# Patient Record
Sex: Male | Born: 2000 | Race: Black or African American | Hispanic: No | Marital: Single | Smoking: Never smoker
Health system: Southern US, Community
[De-identification: ages and names within clinical notes are randomized; demographics above are authoritative.]

## PROBLEM LIST (undated history)

## (undated) DIAGNOSIS — R5600 Simple febrile convulsions: Secondary | ICD-10-CM

## (undated) DIAGNOSIS — F840 Autistic disorder: Secondary | ICD-10-CM

---
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
## 2022-01-18 ENCOUNTER — Other Ambulatory Visit: Payer: Self-pay | Admitting: Internal Medicine

## 2022-01-19 LAB — COMPLETE METABOLIC PANEL WITH GFR
AG Ratio: 1.8 (calc) (ref 1.0–2.5)
ALT: 30 U/L (ref 8–46)
AST: 21 U/L (ref 12–32)
Albumin: 4.4 g/dL (ref 3.6–5.1)
Alkaline phosphatase (APISO): 69 U/L (ref 46–169)
BUN: 12 mg/dL (ref 7–20)
CO2: 24 mmol/L (ref 20–32)
Calcium: 9.3 mg/dL (ref 8.9–10.4)
Chloride: 107 mmol/L (ref 98–110)
Creat: 0.84 mg/dL (ref 0.60–1.24)
Globulin: 2.5 g/dL (calc) (ref 2.1–3.5)
Glucose, Bld: 92 mg/dL (ref 65–99)
Potassium: 4 mmol/L (ref 3.8–5.1)
Sodium: 141 mmol/L (ref 135–146)
Total Bilirubin: 0.8 mg/dL (ref 0.2–1.1)
Total Protein: 6.9 g/dL (ref 6.3–8.2)
eGFR: 129 mL/min/{1.73_m2} (ref >=60)

## 2022-01-19 LAB — LIPID PANEL
Cholesterol: 123 mg/dL (ref <170)
HDL: 34 mg/dL — ABNORMAL LOW (ref >45)
LDL Cholesterol (Calc): 74 mg/dL (calc) (ref <110)
Non-HDL Cholesterol (Calc): 89 mg/dL (calc) (ref <120)
Total CHOL/HDL Ratio: 3.6 (calc) (ref <5.0)
Triglycerides: 69 mg/dL (ref <90)

## 2022-01-19 LAB — CBC
HCT: 40.8 % (ref 38.5–50.0)
Hemoglobin: 13.5 g/dL (ref 13.2–17.1)
MCH: 28.8 pg (ref 27.0–33.0)
MCHC: 33.1 g/dL (ref 32.0–36.0)
MCV: 87 fL (ref 80.0–100.0)
MPV: 10.4 fL (ref 7.5–12.5)
Platelets: 244 10*3/uL (ref 140–400)
RBC: 4.69 10*6/uL (ref 4.20–5.80)
RDW: 11.5 % (ref 11.0–15.0)
WBC: 3 10*3/uL — ABNORMAL LOW (ref 3.8–10.8)

## 2022-01-19 LAB — VITAMIN D 25 HYDROXY (VIT D DEFICIENCY, FRACTURES): Vit D, 25-Hydroxy: 12 ng/mL — ABNORMAL LOW (ref 30–100)

## 2022-04-25 ENCOUNTER — Emergency Department (HOSPITAL_COMMUNITY): Payer: Medicaid Other

## 2022-04-25 ENCOUNTER — Encounter (HOSPITAL_COMMUNITY): Payer: Self-pay | Admitting: Emergency Medicine

## 2022-04-25 ENCOUNTER — Emergency Department (HOSPITAL_COMMUNITY)
Admission: EM | Admit: 2022-04-25 | Discharge: 2022-04-25 | Disposition: A | Discharge status: Home / Self Care | Payer: Medicaid Other | Attending: Emergency Medicine | Admitting: Emergency Medicine

## 2022-04-25 ENCOUNTER — Other Ambulatory Visit: Payer: Self-pay

## 2022-04-25 DIAGNOSIS — R519 Headache, unspecified: Secondary | ICD-10-CM

## 2022-04-25 DIAGNOSIS — M542 Cervicalgia: Secondary | ICD-10-CM | POA: Insufficient documentation

## 2022-04-25 DIAGNOSIS — F840 Autistic disorder: Secondary | ICD-10-CM | POA: Diagnosis not present

## 2022-04-25 DIAGNOSIS — R110 Nausea: Secondary | ICD-10-CM | POA: Diagnosis not present

## 2022-04-25 LAB — COMPREHENSIVE METABOLIC PANEL
ALT: 28 U/L (ref 0–44)
AST: 23 U/L (ref 15–41)
Albumin: 4.9 g/dL (ref 3.5–5.0)
Alkaline Phosphatase: 54 U/L (ref 38–126)
Anion gap: 10 (ref 5–15)
BUN: 13 mg/dL (ref 6–20)
CO2: 24 mmol/L (ref 22–32)
Calcium: 10 mg/dL (ref 8.9–10.3)
Chloride: 104 mmol/L (ref 98–111)
Creatinine, Ser: 1.04 mg/dL (ref 0.61–1.24)
GFR, Estimated: >60 mL/min (ref >60)
Glucose, Bld: 120 mg/dL — ABNORMAL HIGH (ref 70–99)
Potassium: 3.3 mmol/L — ABNORMAL LOW (ref 3.5–5.1)
Sodium: 138 mmol/L (ref 135–145)
Total Bilirubin: 0.9 mg/dL (ref 0.3–1.2)
Total Protein: 7.8 g/dL (ref 6.5–8.1)

## 2022-04-25 LAB — CBC WITH DIFFERENTIAL/PLATELET
Abs Immature Granulocytes: 0.01 10*3/uL (ref 0.00–0.07)
Basophils Absolute: 0 10*3/uL (ref 0.0–0.1)
Basophils Relative: 1 %
Eosinophils Absolute: 0.1 10*3/uL (ref 0.0–0.5)
Eosinophils Relative: 1 %
HCT: 43.7 % (ref 39.0–52.0)
Hemoglobin: 15.3 g/dL (ref 13.0–17.0)
Immature Granulocytes: 0 %
Lymphocytes Relative: 47 %
Lymphs Abs: 1.8 10*3/uL (ref 0.7–4.0)
MCH: 29.3 pg (ref 26.0–34.0)
MCHC: 35 g/dL (ref 30.0–36.0)
MCV: 83.6 fL (ref 80.0–100.0)
Monocytes Absolute: 0.3 10*3/uL (ref 0.1–1.0)
Monocytes Relative: 7 %
Neutro Abs: 1.7 10*3/uL (ref 1.7–7.7)
Neutrophils Relative %: 44 %
Platelets: 243 10*3/uL (ref 150–400)
RBC: 5.23 MIL/uL (ref 4.22–5.81)
RDW: 11.8 % (ref 11.5–15.5)
WBC: 3.7 10*3/uL — ABNORMAL LOW (ref 4.0–10.5)
nRBC: 0 % (ref 0.0–0.2)

## 2022-04-25 NOTE — ED Notes (Signed)
Patient verbalizes understanding of discharge instructions. Opportunity for questioning and answers were provided. Armband removed by staff, pt discharged from ED.  

## 2022-04-25 NOTE — ED Notes (Signed)
Reports headache is better.  When he arrived headache was similar to his previous headaches per patient

## 2022-04-25 NOTE — ED Triage Notes (Signed)
Patient reports chronic headache and posterior neck pain for several weeks , denies injury , no stiffness or fever .
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
## 2022-04-30 ENCOUNTER — Ambulatory Visit (HOSPITAL_COMMUNITY)
Admission: EM | Admit: 2022-04-30 | Discharge: 2022-04-30 | Disposition: A | Discharge status: Home / Self Care | Payer: Medicaid Other | Attending: Family Medicine | Admitting: Family Medicine

## 2022-04-30 ENCOUNTER — Encounter (HOSPITAL_COMMUNITY): Payer: Self-pay

## 2022-04-30 ENCOUNTER — Ambulatory Visit (HOSPITAL_COMMUNITY): Payer: Self-pay

## 2022-04-30 DIAGNOSIS — R519 Headache, unspecified: Secondary | ICD-10-CM

## 2022-04-30 DIAGNOSIS — S0990XA Unspecified injury of head, initial encounter: Secondary | ICD-10-CM

## 2022-04-30 MED ORDER — BUTALBITAL-APAP-CAFFEINE 50-325-40 MG PO TABS: 1.0000 | ORAL_TABLET | Freq: Two times a day (BID) | ORAL | 0 refills | Status: AC | PRN

## 2022-04-30 MED ORDER — BUTALBITAL-APAP-CAFFEINE 50-325-40 MG PO TABS: 1.0000 | ORAL_TABLET | Freq: Two times a day (BID) | ORAL | 0 refills | Status: DC | PRN
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
## 2022-05-05 ENCOUNTER — Ambulatory Visit (HOSPITAL_COMMUNITY): Payer: Self-pay

## 2022-05-06 ENCOUNTER — Ambulatory Visit (INDEPENDENT_AMBULATORY_CARE_PROVIDER_SITE_OTHER): Payer: Medicaid Other | Admitting: Psychiatry

## 2022-05-06 VITALS — BP 111/77 | HR 93 | Ht 68.0 in | Wt 132.5 lb

## 2022-05-06 DIAGNOSIS — G44209 Tension-type headache, unspecified, not intractable: Secondary | ICD-10-CM

## 2022-05-06 MED ORDER — DICLOFENAC POTASSIUM 50 MG PO TABS: ORAL_TABLET | ORAL | 6 refills | Status: DC

## 2022-05-06 MED ORDER — ONDANSETRON 8 MG PO TBDP: 8.0000 mg | ORAL_TABLET | Freq: Three times a day (TID) | ORAL | 6 refills | Status: AC | PRN

## 2022-05-06 NOTE — Progress Notes (Signed)
Referring:  Scot Jun, FNP [address] [place],  [place] [address]  PCP: Raechel Ache, MD (Inactive)  Neurology was asked to evaluate Jose Morrison, a 21 year old male for a chief complaint of headaches.  Our recommendations of care will be communicated by shared medical record.    CC:  headaches  History provided from self, mom  HPI:  Medical co-morbidities: febrile seizures, autism  The patient presents for evaluation of headaches which began several weeks ago. No clear triggers for onset of headaches. Headaches are currently daily. They generally begin in the evening and last a couple of hours at a time. They are described as pressure in the left face and temple. He does have some nausea with them. No associated photophobia or phonophobia.  Takes Tylenol as needed which does help temporarily, but headache always returns.  [facility] 04/25/22 was unremarkable.  Headache History: Onset: several weeks ago Triggers: pressing on his eyes Aura: none Location: left face and temples Quality/Description: pressure Associated Symptoms:  Photophobia: no  Phonophobia: no  Nausea: yes Vomiting: no Worse with activity?: no Duration of headaches: 1-2 hour  Headache days per month: 30 Headache free days per month: 0  Current Treatment: Abortive Tylenol  Preventative none  Prior Therapies                                 Fioricet - hallucinations  LABS: CBC    Component Value Date/Time   WBC 3.7 (L) 04/25/2022 0319   RBC 5.23 04/25/2022 0319   HGB 15.3 04/25/2022 0319   HCT 43.7 04/25/2022 0319   PLT 243 04/25/2022 0319   MCV 83.6 04/25/2022 0319   MCH 29.3 04/25/2022 0319   MCHC 35.0 04/25/2022 0319   RDW 11.8 04/25/2022 0319   LYMPHSABS 1.8 04/25/2022 0319   MONOABS 0.3 04/25/2022 0319   EOSABS 0.1 04/25/2022 0319   BASOSABS 0.0 04/25/2022 0319      Latest Ref Rng & Units 04/25/2022    3:19 AM 01/18/2022   12:00 AM  CMP  Glucose 70 - 99 mg/dL  120  92   BUN 6 - 20 mg/dL 13  12   Creatinine 0.61 - 1.24 mg/dL 1.04  0.84   Sodium 135 - 145 mmol/L 138  141   Potassium 3.5 - 5.1 mmol/L 3.3  4.0   Chloride 98 - 111 mmol/L 104  107   CO2 22 - 32 mmol/L 24  24   Calcium 8.9 - 10.3 mg/dL 10.0  9.3   Total Protein 6.5 - 8.1 g/dL 7.8  6.9   Total Bilirubin 0.3 - 1.2 mg/dL 0.9  0.8   Alkaline Phos 38 - 126 U/L 54    AST 15 - 41 U/L 23  21   ALT 0 - 44 U/L 28  30      IMAGING:  [facility] 04/25/22: unremarkable  Imaging independently reviewed on May 06, 2022   Current Outpatient Medications on File Prior to Visit  Medication Sig Dispense Refill   butalbital-acetaminophen-caffeine (FIORICET) 50-325-40 MG tablet Take 1 tablet by mouth every 12 (twelve) hours as needed for headache. (Patient not taking: Reported on 05/06/2022) 14 tablet 0   diazepam (DIASTAT ACUDIAL) 10 MG GEL Place 7.5 mg rectally once. (Patient not taking: Reported on 05/06/2022) 7.5 mg 2   simethicone (MYLICON) 40 XB/1.4NW drops Take 0.6 mLs (40 mg total) by mouth 4 (four) times daily  as needed for flatulence (gas). (Patient not taking: Reported on 05/06/2022) 30 mL 0   No current facility-administered medications on file prior to visit.     Allergies: No Known Allergies  Family History: Migraine or other headaches in the family:  mother has migraines Aneurysms in a first degree relative:  no Brain tumors in the family:  no Other neurological illness in the family:   no  Past Medical History: Past Medical History:  Diagnosis Date   Autism    Febrile seizures (HCC)     Past Surgical History No past surgical history on file.  Social History: Social History   Tobacco Use   Smoking status: Never   Smokeless tobacco: Never  Substance Use Topics   Alcohol use: Never     ROS: Negative for fevers, chills. Positive for headaches. All other systems reviewed and negative unless stated otherwise in HPI.   Physical Exam:   Vital Signs: BP 111/77    Pulse 93   Ht 5\' 8"  (1.727 m)   Wt 132 lb 8 oz (60.1 kg)   BMI 20.15 kg/m  GENERAL: well appearing,in no acute distress,alert SKIN:  Color, texture, turgor normal. No rashes or lesions HEAD:  Normocephalic/atraumatic. CV:  RRR RESP: Normal respiratory effort MSK: no tenderness to palpation over occiput, neck, or shoulders  NEUROLOGICAL: Mental Status: Alert, oriented to person, place and time,Follows commands Cranial Nerves: PERRL, visual fields intact to confrontation, extraocular movements intact, facial sensation intact, no facial droop or ptosis, hearing grossly intact, no dysarthria,  Motor: muscle strength 5/5 both upper and lower extremities Reflexes: 2+ throughout Sensation: intact to light touch all 4 extremities Coordination: Finger-to- nose-finger intact bilaterally Gait: normal-based   IMPRESSION: 21 year old male with a history of febrile seizures, autism who presents for evaluation of headaches. CTH earlier this month was unremarkable. Neurological exam today is normal. His current headaches are most consistent with tension-type headache. Will start diclofenac for rescue. Discussed preventive options, and mother would like to research medications before starting one. Supplement information for headache prevention provided.  PLAN: -Rescue: Start diclofenac 50-100 mg PRN -Supplement information for headache prevention provided (Mg, B2, CoQ10) -Mother will research preventive options and let me know if they choose to pursue this   I spent a total of 24 minutes chart reviewing and counseling the patient. Headache education was done. Discussed treatment options including preventive and acute medications, and natural supplements. Discussed medication overuse headache and to limit use of acute treatments to no more than 2 days/week or 10 days/month. Discussed medication side effects, adverse reactions and drug interactions. Written educational materials and patient instructions  outlining all of the above were given.  Follow-up: 3 months   12, MD 05/06/2022   1:40 PM

## 2022-05-06 NOTE — Patient Instructions (Addendum)
Common preventive headache medications:  Antidepressants: amitriptyline, Cymbalta Anti convulsants: Topamax Blood pressure medication: propranolol   Start diclofenac as needed for migraines Take ondansetron as needed for nausea    GENERAL HEADACHE INSTRUCTIONS Headache Preventive Treatment: Please keep in mind that it takes 4-6 weeks for the medication to start working well and 2-3 months at the appropriate dose before deciding if it will be useful or not. If it is not helping at all by this time, then we will discuss other medications to try. Supplements may take 3-6 months until you see full effect.   Natural supplements: Magnesium Oxide or Magnesium Glycinate 500 mg at bed (up to 800 mg daily) Coenzyme Q10 300 mg in AM Vitamin B2- 200 mg twice a day  Add 1 supplement at a time since even natural supplements can have undesirable side effects. You can sometimes buy supplements cheaper (especially Coenzyme Q10) at www.WebmailGuide.co.za or at [employer].  Vitamins and herbs that show potential:  Magnesium: Magnesium (250 mg twice a day or 500 mg at bed) has a relaxant effect on smooth muscles such as blood vessels. Individuals suffering from frequent or daily headache usually have low magnesium levels which can be increase with daily supplementation of 400-750 mg. Three trials found 40-90% average headache reduction  when used as a preventative. Magnesium also demonstrated the benefit in menstrually related migraine.  Magnesium is part of the messenger system in the serotonin cascade and it is a good muscle relaxant.  It is also useful for constipation which can be a side effect of other medications used to treat migraine. Good sources include nuts, whole grains, and tomatoes. Side Effects: loose stool/diarrhea Riboflavin (vitamin B 2) 200 mg twice a day. This vitamin assists nerve cells in the production of ATP a principal energy storing molecule.  It is necessary for many chemical reactions in the  body.  There have been at least 3 clinical trials of riboflavin using 400 mg per day all of which suggested that migraine frequency can be decreased.  All 3 trials showed significant improvement in over half of migraine sufferers.  The supplement is found in bread, cereal, milk, meat, and poultry.  Most Americans get more riboflavin than the recommended daily allowance, however riboflavin deficiency is not necessary for the supplements to help prevent headache. Side effects: energizing, green urine  Coenzyme Q10: This is present in almost all cells in the body and is critical component for the conversion of energy.  Recent studies have shown that a nutritional supplement of CoQ10 can reduce the frequency of migraine attacks by improving the energy production of cells as with riboflavin.  Doses of 150 mg twice a day have been shown to be effective.  Melatonin: Increasing evidence shows correlation between melatonin secretion and headache conditions.  Melatonin supplementation has decreased headache intensity and duration.  It is widely used as a sleep aid.  Sleep is natures way of dealing with migraine.  A dose of 3 mg is recommended to start for headaches including cluster headache. Higher doses up to 15 mg has been reviewed for use in Cluster headache and have been used. The rationale behind using melatonin for cluster is that many theories regarding the cause of Cluster headache center around the disruption of the normal circadian rhythm in the brain.  This helps restore the normal circadian rhythm.  HEADACHE DIET: Foods and beverages which may trigger migraine Note that only 20% of headache patients are food sensitive. You will know if you are  food sensitive if you get a headache consistently 20 minutes to 2 hours after eating a certain food. Only cut out a food if it causes headaches, otherwise you might remove foods you enjoy! What matters most for diet is to eat a well balanced healthy diet full of  vegetables and low fat protein, and to not miss meals.  Chocolate, other sweets ALL cheeses except cottage and cream cheese Dairy products, yogurt, sour cream, ice cream Liver Meat extracts (Bovril, Marmite, meat tenderizers) Meats or fish which have undergone aging, fermenting, pickling or smoking. These include: Hotdogs,salami,Lox,sausage, mortadellas,smoked salmon, pepperoni, Pickled herring Pods of broad bean (English beans, Chinese pea pods, [place] (fava) beans, lima and navy beans Ripe avocado, ripe banana Yeast extracts or active yeast preparations such as Brewer's or Fleishman's (commercial bakes goods are permitted) Tomato based foods, pizza (lasagna, etc.)  MSG (monosodium glutamate) is disguised as many things; look for these common aliases: Monopotassium glutamate Autolysed yeast Hydrolysed protein Sodium caseinate "flavorings" "all natural preservatives" Nutrasweet  Avoid all other foods that convincingly provoke headaches.  Resources: The Dizzy Lu Duffel Your Headache Diet, migrainestrong.com  https://www.aguirre.org/  Caffeine and Migraine For patients that have migraine, caffeine intake more than 3 days per week can lead to dependency and increased migraine frequency. I would recommend cutting back on your caffeine intake as best you can. The recommended amount of caffeine is 200-300 mg daily, although migraine patients may experience dependency at even lower doses. While you may notice an increase in headache temporarily, cutting back will be helpful for headaches in the long run. For more information on caffeine and migraine, visit: https://americanmigrainefoundation.org/resource-library/caffeine-and-migraine/  Headache Prevention Strategies:  1. Maintain a headache diary; learn to identify and avoid triggers.  - This can be a simple note where you log when you had a headache, associated symptoms, and medications  used - There are several smartphone apps developed to help track migraines: Migraine Buddy, Migraine Monitor, Curelator N1-Headache App  Common triggers include: Emotional triggers: Emotional/Upset family or friends Emotional/Upset occupation Business reversal/success Anticipation anxiety Crisis-serious Post-crisis periodNew job/position   Physical triggers: Vacation Day Weekend Strenuous Exercise High Altitude Location New Move Menstrual Day Physical Illness Oversleep/Not enough sleep Weather changes Light: Photophobia or light sesnitivity treatment involves a balance between desensitization and reduction in overly strong input. Use dark polarized glasses outside, but not inside. Avoid bright or fluorescent light, but do not dim environment to the point that going into a normally lit room hurts. Consider FL-41 tint lenses, which reduce the most irritating wavelengths without blocking too much light.  These can be obtained at axonoptics.com or theraspecs.com Foods: see list above.  2. Limit use of acute treatments (over-the-counter medications, triptans, etc.) to no more than 2 days per week or 10 days per month to prevent medication overuse headache (rebound headache).    3. Follow a regular schedule (including weekends and holidays): Don't skip meals. Eat a balanced diet. 8 hours of sleep nightly. Minimize stress. Exercise 30 minutes per day. Being overweight is associated with a 5 times increased risk of chronic migraine. Keep well hydrated and drink 6-8 glasses of water per day.  4. Initiate non-pharmacologic measures at the earliest onset of your headache. Rest and quiet environment. Relax and reduce stress. Breathe2Relax is a free app that can instruct you on    some simple relaxtion and breathing techniques. Http://Dawnbuse.com is a    free website that provides teaching videos on relaxation.  Also, there are  many apps that  can be downloaded for "mindful" relaxation.  An  app called YOGA NIDRA will help walk you through mindfulness. Another app called Calm can be downloaded to give you a structured mindfulness guide with daily reminders and skill development. Headspace for guided meditation Mindfulness Based Stress Reduction Online Course: www.palousemindfulness.com Cold compresses.  5. Don't wait!! Take the maximum allowable dosage of prescribed medication at the first sign of migraine.  6. Compliance:  Take prescribed medication regularly as directed and at the first sign of a migraine.  7. Communicate:  Call your physician when problems arise, especially if your headaches change, increase in frequency/severity, or become associated with neurological symptoms (weakness, numbness, slurred speech, etc.).  8. Headache/pain management therapies: Consider various complementary methods, including medication, behavioral therapy, psychological counselling, biofeedback, massage therapy, acupuncture, dry needling, and other modalities.  Such measures may reduce the need for medications. Counseling for pain management, where patients learn to function and ignore/minimize their pain, seems to work very well.  9. Recommend changing family's attention and focus away from patient's headaches. Instead, emphasize daily activities. If first question of day is 'How are your headaches/Do you have a headache today?', then patient will constantly think about headaches, thus making them worse. Goal is to re-direct attention away from headaches, toward daily activities and other distractions.  10. Helpful Websites: www.AmericanHeadacheSociety.org PatentHood.ch www.headaches.org TightMarket.nl www.achenet.org

## 2022-05-10 ENCOUNTER — Other Ambulatory Visit: Payer: Self-pay | Admitting: Psychiatry

## 2022-05-10 MED ORDER — MELOXICAM 10 MG PO CAPS: 10.0000 mg | ORAL_CAPSULE | ORAL | 6 refills | Status: AC | PRN

## 2022-05-28 ENCOUNTER — Ambulatory Visit: Payer: Medicaid Other | Admitting: Psychiatry

## 2022-08-23 ENCOUNTER — Encounter: Payer: Self-pay | Admitting: Family Medicine

## 2022-08-23 ENCOUNTER — Ambulatory Visit: Payer: Medicaid Other | Admitting: Family Medicine

## 2022-08-23 NOTE — Patient Instructions (Incomplete)

## 2022-08-23 NOTE — Progress Notes (Deleted)
No chief complaint on file.   HISTORY OF PRESENT ILLNESS:  08/23/22 ALL:  Jose Morrison is a 22 y.o. male here today for follow up for  headaches. He was seen in consult with Dr Jose Morrison 04/2022 and started on diclofenac as needed for abortive therapy. Preventative meds discussed but declined. Since,    HISTORY (copied from Dr Jose Morrison previous note)  HPI:  Medical co-morbidities: febrile seizures, autism   The patient presents for evaluation of headaches which began several weeks ago. No clear triggers for onset of headaches. Headaches are currently daily. They generally begin in the evening and last a couple of hours at a time. They are described as pressure in the left face and temple. He does have some nausea with them. No associated photophobia or phonophobia.   Takes Tylenol as needed which does help temporarily, but headache always returns.   [facility] 04/25/22 was unremarkable.   Headache History: Onset: several weeks ago Triggers: pressing on his eyes Aura: none Location: left face and temples Quality/Description: pressure Associated Symptoms:             Photophobia: no             Phonophobia: no             Nausea: yes Vomiting: no Worse with activity?: no Duration of headaches: 1-2 hour   Headache days per month: 30 Headache free days per month: 0   Current Treatment: Abortive Tylenol   Preventative none   Prior Therapies                                 Fioricet - hallucinations   REVIEW OF SYSTEMS: Out of a complete 14 system review of symptoms, the patient complains only of the following symptoms, and all other reviewed systems are negative.   ALLERGIES: No Known Allergies   HOME MEDICATIONS: Outpatient Medications Prior to Visit  Medication Sig Dispense Refill   butalbital-acetaminophen-caffeine (FIORICET) 50-325-40 MG tablet Take 1 tablet by mouth every 12 (twelve) hours as needed for headache. (Patient not taking: Reported on 05/06/2022) 14 tablet  0   diazepam (DIASTAT ACUDIAL) 10 MG GEL Place 7.5 mg rectally once. (Patient not taking: Reported on 05/06/2022) 7.5 mg 2   Meloxicam 10 MG CAPS Take 10 mg by mouth as needed (headache). Max dose 1 pill in 24 hours 10 capsule 6   ondansetron (ZOFRAN-ODT) 8 MG disintegrating tablet Take 1 tablet (8 mg total) by mouth every 8 (eight) hours as needed for nausea or vomiting. 20 tablet 6   simethicone (MYLICON) 40 MG/0.6ML drops Take 0.6 mLs (40 mg total) by mouth 4 (four) times daily as needed for flatulence (gas). (Patient not taking: Reported on 05/06/2022) 30 mL 0   No facility-administered medications prior to visit.     PAST MEDICAL HISTORY: Past Medical History:  Diagnosis Date   Autism    Febrile seizures (HCC)      PAST SURGICAL HISTORY: No past surgical history on file.   FAMILY HISTORY: Family History  Family history unknown: Yes     SOCIAL HISTORY: Social History   Socioeconomic History   Marital status: Single    Spouse name: Not on file   Number of children: Not on file   Years of education: Not on file   Highest education level: Not on file  Occupational History   Not on file  Tobacco Use  Smoking status: Never   Smokeless tobacco: Never  Substance and Sexual Activity   Alcohol use: Never   Drug use: Not on file   Sexual activity: Not on file  Other Topics Concern   Not on file  Social History Narrative   Not on file   Social Determinants of Health   Financial Resource Strain: Not on file  Food Insecurity: Not on file  Transportation Needs: Not on file  Physical Activity: Not on file  Stress: Not on file  Social Connections: Not on file  Intimate Partner Violence: Not on file     PHYSICAL EXAM  There were no vitals filed for this visit. There is no height or weight on file to calculate BMI.  Generalized: Well developed, in no acute distress  Cardiology: normal rate and rhythm, no murmur auscultated  Respiratory: clear to auscultation  bilaterally    Neurological examination  Mentation: Alert oriented to time, place, history taking. Follows all commands speech and language fluent Cranial nerve II-XII: Pupils were equal round reactive to light. Extraocular movements were full, visual field were full on confrontational test. Facial sensation and strength were normal. Uvula tongue midline. Head turning and shoulder shrug  were normal and symmetric. Motor: The motor testing reveals 5 over 5 strength of all 4 extremities. Good symmetric motor tone is noted throughout.  Sensory: Sensory testing is intact to soft touch on all 4 extremities. No evidence of extinction is noted.  Coordination: Cerebellar testing reveals good finger-nose-finger and heel-to-shin bilaterally.  Gait and station: Gait is normal. Tandem gait is normal. Romberg is negative. No drift is seen.  Reflexes: Deep tendon reflexes are symmetric and normal bilaterally.    DIAGNOSTIC DATA (LABS, IMAGING, TESTING) - I reviewed patient records, labs, notes, testing and imaging myself where available.  Lab Results  Component Value Date   WBC 3.7 (L) 04/25/2022   HGB 15.3 04/25/2022   HCT 43.7 04/25/2022   MCV 83.6 04/25/2022   PLT 243 04/25/2022      Component Value Date/Time   NA 138 04/25/2022 0319   K 3.3 (L) 04/25/2022 0319   CL 104 04/25/2022 0319   CO2 24 04/25/2022 0319   GLUCOSE 120 (H) 04/25/2022 0319   BUN 13 04/25/2022 0319   CREATININE 1.04 04/25/2022 0319   CREATININE 0.84 01/18/2022 0000   CALCIUM 10.0 04/25/2022 0319   PROT 7.8 04/25/2022 0319   ALBUMIN 4.9 04/25/2022 0319   AST 23 04/25/2022 0319   ALT 28 04/25/2022 0319   ALKPHOS 54 04/25/2022 0319   BILITOT 0.9 04/25/2022 0319   GFRNONAA >60 04/25/2022 0319   Lab Results  Component Value Date   CHOL 123 01/18/2022   HDL 34 (L) 01/18/2022   LDLCALC 74 01/18/2022   TRIG 69 01/18/2022   CHOLHDL 3.6 01/18/2022   No results found for: "HGBA1C" No results found for:  "VITAMINB12" No results found for: "TSH"      No data to display               No data to display           ASSESSMENT AND PLAN  22 y.o. year old male  has a past medical history of Autism and Febrile seizures ([place]). here with    No diagnosis found.  Carmin Richmond ***.  Healthy lifestyle habits encouraged. *** will follow up with PCP as directed. *** will return to see me in ***, sooner if needed. *** verbalizes understanding and agreement with  this plan.   No orders of the defined types were placed in this encounter.    No orders of the defined types were placed in this encounter.    Shawnie Dapper, MSN, FNP-C 08/23/2022, 8:48 AM  [facility] [address], [address] [place], [place] [address] [phone]

## 2023-04-08 ENCOUNTER — Ambulatory Visit: Payer: Medicaid Other | Admitting: Family Medicine
